# Patient Record
Sex: Female | Born: 1955 | Race: White | Hispanic: No | Marital: Married | Smoking: Never smoker
Health system: Southern US, Community
[De-identification: ages and names within clinical notes are randomized; demographics above are authoritative.]

## PROBLEM LIST (undated history)

## (undated) DIAGNOSIS — E079 Disorder of thyroid, unspecified: Secondary | ICD-10-CM

## (undated) DIAGNOSIS — M5020 Other cervical disc displacement, unspecified cervical region: Secondary | ICD-10-CM

## (undated) DIAGNOSIS — E039 Hypothyroidism, unspecified: Secondary | ICD-10-CM

## (undated) HISTORY — PX: APPENDECTOMY: SHX54

## (undated) HISTORY — PX: ABDOMINAL HYSTERECTOMY: SHX81

## (undated) HISTORY — DX: Disorder of thyroid, unspecified: E07.9

## (undated) HISTORY — PX: BREAST SURGERY: SHX581

---

## 1988-05-27 HISTORY — PX: BREAST BIOPSY: SHX20

## 2004-04-25 ENCOUNTER — Ambulatory Visit: Payer: Self-pay

## 2004-05-24 ENCOUNTER — Ambulatory Visit: Payer: Self-pay

## 2004-06-14 ENCOUNTER — Ambulatory Visit: Payer: Self-pay

## 2006-07-17 ENCOUNTER — Ambulatory Visit: Payer: Self-pay

## 2006-09-25 ENCOUNTER — Ambulatory Visit: Payer: Self-pay | Admitting: Gastroenterology

## 2008-11-15 ENCOUNTER — Ambulatory Visit: Payer: Self-pay | Admitting: Specialist

## 2009-12-28 ENCOUNTER — Ambulatory Visit: Payer: Self-pay | Admitting: Specialist

## 2014-04-29 DIAGNOSIS — T148XXA Other injury of unspecified body region, initial encounter: Secondary | ICD-10-CM | POA: Insufficient documentation

## 2015-10-24 DIAGNOSIS — F419 Anxiety disorder, unspecified: Secondary | ICD-10-CM | POA: Insufficient documentation

## 2015-10-24 DIAGNOSIS — E7800 Pure hypercholesterolemia, unspecified: Secondary | ICD-10-CM | POA: Insufficient documentation

## 2015-10-24 DIAGNOSIS — E039 Hypothyroidism, unspecified: Secondary | ICD-10-CM | POA: Insufficient documentation

## 2015-10-24 DIAGNOSIS — K589 Irritable bowel syndrome without diarrhea: Secondary | ICD-10-CM | POA: Insufficient documentation

## 2015-10-25 ENCOUNTER — Telehealth: Payer: Self-pay | Admitting: Gastroenterology

## 2015-10-25 NOTE — Telephone Encounter (Signed)
colonoscopy

## 2015-10-31 ENCOUNTER — Telehealth: Payer: Self-pay | Admitting: Gastroenterology

## 2015-10-31 NOTE — Telephone Encounter (Signed)
colonoscopy

## 2015-11-03 NOTE — Telephone Encounter (Signed)
LVM for pt to return my call.

## 2015-11-09 NOTE — Telephone Encounter (Signed)
Left vm again for pt to return my call. Mailed letter.  

## 2015-11-15 ENCOUNTER — Telehealth: Payer: Self-pay | Admitting: Gastroenterology

## 2015-11-15 NOTE — Telephone Encounter (Signed)
Patient is returning your call. Please call today.

## 2015-11-16 NOTE — Telephone Encounter (Signed)
Patient returned your call/colonoscopy

## 2015-11-17 ENCOUNTER — Other Ambulatory Visit: Payer: Self-pay

## 2015-11-17 NOTE — Telephone Encounter (Signed)
Pt scheduled for screening colonoscopy at [facility] on 12/12/15. Instructs/rx mailed. Please precert.  

## 2015-11-17 NOTE — Telephone Encounter (Signed)
Gastroenterology Pre-Procedure Review  Request Date: 12/12/15 Requesting Physician: Dr. Fulton Reek  PATIENT REVIEW QUESTIONS: The patient responded to the following health history questions as indicated:    1. Are you having any GI issues? no 2. Do you have a personal history of Polyps? no 3. Do you have a family history of Colon Cancer or Polyps? yes (Mother)  4. Diabetes Mellitus? no 5. Joint replacements in the past 12 months?no 6. Major health problems in the past 3 months?no 7. Any artificial heart valves, MVP, or defibrillator?no    MEDICATIONS & ALLERGIES:    Patient reports the following regarding taking any anticoagulation/antiplatelet therapy:   Plavix, Coumadin, Eliquis, Xarelto, Lovenox, Pradaxa, Brilinta, or Effient? no Aspirin? no  Patient confirms/reports the following medications:  No current outpatient prescriptions on file.   No current facility-administered medications for this visit.    Patient confirms/reports the following allergies:  No Known Allergies  No orders of the defined types were placed in this encounter.    AUTHORIZATION INFORMATION Primary Insurance: 1D#: Group #:  Secondary Insurance: 1D#: Group #:  SCHEDULE INFORMATION: Date: 12/12/15 Time: Location: [place]

## 2015-11-17 NOTE — Telephone Encounter (Signed)
LVM for pt to return my call.

## 2015-11-30 ENCOUNTER — Telehealth: Payer: Self-pay | Admitting: Gastroenterology

## 2015-11-30 NOTE — Telephone Encounter (Signed)
PLEASE CALL REGARDING COLONOSCOPY

## 2015-12-06 NOTE — Telephone Encounter (Signed)
Pt had questions about medications prior to colonoscopy. These questions answered.

## 2015-12-12 ENCOUNTER — Ambulatory Visit: Admission: RE | Admit: 2015-12-12 | Payer: 59 | Source: Ambulatory Visit | Admitting: Gastroenterology

## 2015-12-12 ENCOUNTER — Ambulatory Visit: Admit: 2015-12-12 | Payer: Self-pay | Admitting: Gastroenterology

## 2015-12-12 ENCOUNTER — Encounter: Admission: RE | Payer: Self-pay | Source: Ambulatory Visit

## 2015-12-12 SURGERY — COLONOSCOPY WITH PROPOFOL
Anesthesia: Choice

## 2015-12-12 SURGERY — COLONOSCOPY WITH PROPOFOL
Anesthesia: General

## 2016-08-12 ENCOUNTER — Encounter: Payer: Self-pay | Admitting: Gynecology

## 2016-08-12 ENCOUNTER — Ambulatory Visit (INDEPENDENT_AMBULATORY_CARE_PROVIDER_SITE_OTHER): Payer: 59 | Admitting: Gynecology

## 2016-08-12 ENCOUNTER — Other Ambulatory Visit: Payer: Self-pay | Admitting: Gynecology

## 2016-08-12 VITALS — BP 114/66 | Ht 63.0 in | Wt 130.0 lb

## 2016-08-12 DIAGNOSIS — Z23 Encounter for immunization: Secondary | ICD-10-CM

## 2016-08-12 DIAGNOSIS — N898 Other specified noninflammatory disorders of vagina: Secondary | ICD-10-CM

## 2016-08-12 DIAGNOSIS — Z79890 Hormone replacement therapy (postmenopausal): Secondary | ICD-10-CM | POA: Diagnosis not present

## 2016-08-12 DIAGNOSIS — Z01411 Encounter for gynecological examination (general) (routine) with abnormal findings: Secondary | ICD-10-CM

## 2016-08-12 DIAGNOSIS — N952 Postmenopausal atrophic vaginitis: Secondary | ICD-10-CM | POA: Diagnosis not present

## 2016-08-12 DIAGNOSIS — Z1231 Encounter for screening mammogram for malignant neoplasm of breast: Secondary | ICD-10-CM

## 2016-08-12 LAB — WET PREP FOR TRICH, YEAST, CLUE
CLUE CELLS WET PREP: NONE SEEN
TRICH WET PREP: NONE SEEN
Yeast Wet Prep HPF POC: NONE SEEN

## 2016-08-12 MED ORDER — ESTRADIOL 0.1 MG/GM VA CREA: TOPICAL_CREAM | VAGINAL | 8 refills | Status: DC

## 2016-08-12 NOTE — Addendum Note (Signed)
Addended by: Nelva Nay on: 08/12/2016 05:18 PM   Modules accepted: Orders

## 2016-08-12 NOTE — Patient Instructions (Signed)

## 2016-08-12 NOTE — Progress Notes (Signed)
Patient ID: Charlotte Zuniga, female   DOB: 1955-10-24, 61 y.o.   MRN: [id number]    Charlotte Zuniga April 26, 1956 [id number]   History:    61 y.o.  for annual gyn exam who is a new patient to the practice. Patient stated that her PCP has been doing her blood work and her vaccines are up-to-date. She's been complaining of vaginal dryness and discomfort during intercourse. He was not sure she had a vaginal discharge today or not. She is in a monogamous relationship. Her PCP treated her for yeast infection for which she took some fluconazole for 5 days. She states she recently menopause at age 70 and has had a prior abdominal hysterectomy for benign pathology. Her colonoscopy was reported to be benign in 2009 and her bone density study in 2017 she states had osteopenia and she is taking vitamin D and calcium. She denies any prior history of any abnormal Pap smear. Many years ago she recently menopause at 64 she took estrogen orally but discontinued after short time because of the side effects.  Past medical history,surgical history, family history and social history were all reviewed and documented in the EPIC chart.  Gynecologic History No LMP recorded. Patient has had a hysterectomy. Contraception: status post hysterectomy Last Pap: 2012. Results were: normal Last mammogram: 2 years ago. Results were: normal  Obstetric History OB History  Gravida Para Term Preterm AB Living  3 2     1 2   SAB TAB Ectopic Multiple Live Births  1            # Outcome Date GA Lbr Len/2nd Weight Sex Delivery Anes PTL Lv  3 SAB           2 Para      Vag-Spont     1 Para      Vag-Spont          ROS: A ROS was performed and pertinent positives and negatives are included in the history.  GENERAL: No fevers or chills. HEENT: No change in vision, no earache, sore throat or sinus congestion. NECK: No pain or stiffness. CARDIOVASCULAR: No chest pain or pressure. No palpitations. PULMONARY: No shortness of breath, cough or  wheeze. GASTROINTESTINAL: No abdominal pain, nausea, vomiting or diarrhea, melena or bright red blood per rectum. GENITOURINARY: No urinary frequency, urgency, hesitancy or dysuria. MUSCULOSKELETAL: No joint or muscle pain, no back pain, no recent trauma. DERMATOLOGIC: No rash, no itching, no lesions. ENDOCRINE: No polyuria, polydipsia, no heat or cold intolerance. No recent change in weight. HEMATOLOGICAL: No anemia or easy bruising or bleeding. NEUROLOGIC: No headache, seizures, numbness, tingling or weakness. PSYCHIATRIC: No depression, no loss of interest in normal activity or change in sleep pattern.     Exam: chaperone present  BP 114/66   Ht 5\' 3"  (1.6 m)   Wt 130 lb (59 kg)   BMI 23.03 kg/m   Body mass index is 23.03 kg/m.  General appearance : Well developed well nourished female. No acute distress HEENT: Eyes: no retinal hemorrhage or exudates,  Neck supple, trachea midline, no carotid bruits, no thyroidmegaly Lungs: Clear to auscultation, no rhonchi or wheezes, or rib retractions  Heart: Regular rate and rhythm, no murmurs or gallops Breast:Examined in sitting and supine position were symmetrical in appearance, no palpable masses or tenderness,  no skin retraction, no nipple inversion, no nipple discharge, no skin discoloration, no axillary or supraclavicular lymphadenopathy Abdomen: no palpable masses or tenderness, no rebound or guarding Extremities: no  edema or skin discoloration or tenderness  Pelvic:  Bartholin, Urethra, Skene Glands: Within normal limits             Vagina: No gross lesions or discharge, vaginal atrophy  Cervix:absent  Uterus  absent  Adnexa  Without masses or tenderness  Anus and perineum  normal   Rectovaginal  normal sphincter tone without palpated masses or tenderness             Hemoccult to schedule colonoscopy this year   Wet prep negative few white blood cell few bacteria  Assessment/Plan:  61 y.o. female for annual exam with vaginal  atrophy. I've recommended vaginal estrogen 1 g intravaginally twice a week and to apply a small amount of the external genitalia is well. The risks benefits and pros and cons of the medication were discussed. Patient did not need a Pap smear today. Patient was given a requisition to schedule her mammogram as well as her colonoscopy. Her blood work and vaccines have been done by her PCP.   Terrance Mass MD, 4:11 PM 08/12/2016

## 2016-08-19 ENCOUNTER — Telehealth: Payer: Self-pay | Admitting: Gastroenterology

## 2016-08-19 ENCOUNTER — Other Ambulatory Visit: Payer: Self-pay

## 2016-08-19 DIAGNOSIS — Z800 Family history of malignant neoplasm of digestive organs: Secondary | ICD-10-CM

## 2016-08-19 DIAGNOSIS — Z1211 Encounter for screening for malignant neoplasm of colon: Secondary | ICD-10-CM

## 2016-08-19 NOTE — Telephone Encounter (Signed)
auth# [id number] ded 1500 rem 1500 Max oop 6000 rem [id number] pu 80% auth done  This would apply only if your insurance company considers this as a diagnostic procedure. Most screening procedures are covered at 100%.

## 2016-08-19 NOTE — Telephone Encounter (Signed)
Gastroenterology Pre-Procedure Review  Request Date:  Requesting Physician: Dr.   PATIENT REVIEW QUESTIONS: The patient responded to the following health history questions as indicated:    1. Are you having any GI issues? no 2. Do you have a personal history of Polyps? no 3. Do you have a family history of Colon Cancer or Polyps? yes (Mother) 4. Diabetes Mellitus? no 5. Joint replacements in the past 12 months?no 6. Major health problems in the past 3 months?no 7. Any artificial heart valves, MVP, or defibrillator?no    MEDICATIONS & ALLERGIES:    Patient reports the following regarding taking any anticoagulation/antiplatelet therapy:   Plavix, Coumadin, Eliquis, Xarelto, Lovenox, Pradaxa, Brilinta, or Effient? no Aspirin? no  Patient confirms/reports the following medications:  Current Outpatient Prescriptions  Medication Sig Dispense Refill  . estradiol (ESTRACE) 0.1 MG/GM vaginal cream Apply 1 gram vaginally twice a week 42.5 g 8  . levothyroxine (SYNTHROID, LEVOTHROID) 75 MCG tablet Take 75 mcg by mouth daily before breakfast.     No current facility-administered medications for this visit.     Patient confirms/reports the following allergies:  No Known Allergies  No orders of the defined types were placed in this encounter.   AUTHORIZATION INFORMATION Primary Insurance: 1D#: Group #:  Secondary Insurance: 1D#: Group #:  SCHEDULE INFORMATION: Date: 09/24/16 Time: Location: [place]

## 2016-08-19 NOTE — Telephone Encounter (Signed)
Pt scheduled for a screening colonoscopy at [facility] on 09/24/16 with Wohl.  Screening Z12.11 Family hx of colon cancer Z80.0

## 2016-08-19 NOTE — Telephone Encounter (Signed)
Patient ready to schedule a screening colonoscopy.

## 2016-08-21 ENCOUNTER — Ambulatory Visit
Admission: RE | Admit: 2016-08-21 | Discharge: 2016-08-21 | Disposition: A | Discharge status: Home / Self Care | Payer: 59 | Source: Ambulatory Visit | Attending: Gynecology | Admitting: Gynecology

## 2016-08-21 DIAGNOSIS — Z1231 Encounter for screening mammogram for malignant neoplasm of breast: Secondary | ICD-10-CM

## 2016-08-23 ENCOUNTER — Other Ambulatory Visit: Payer: Self-pay | Admitting: Gynecology

## 2016-08-23 DIAGNOSIS — R928 Other abnormal and inconclusive findings on diagnostic imaging of breast: Secondary | ICD-10-CM

## 2016-08-27 ENCOUNTER — Other Ambulatory Visit: Payer: 59

## 2016-08-29 ENCOUNTER — Telehealth: Payer: Self-pay | Admitting: *Deleted

## 2016-08-29 MED ORDER — FLUCONAZOLE 150 MG PO TABS: 150.0000 mg | ORAL_TABLET | Freq: Once | ORAL | 0 refills | Status: AC

## 2016-08-29 NOTE — Telephone Encounter (Signed)
Pt informed, pt was seen for annual on 08/12/16, Rx sent.

## 2016-08-29 NOTE — Telephone Encounter (Signed)
Call her in a prescription for Diflucan 150 mg PO. BUT....REMIND HER AND GIVE HER AN APPOINTMENT FOR RGCE THAT WAS DUE LAST MONTH. Thank you!

## 2016-08-29 NOTE — Telephone Encounter (Signed)
Pt called c/o yeast infection itching white discharge, pt said she has recurrent yeast sometimes, asked if you would be so kind to send in a Rx? Please advise

## 2016-08-30 ENCOUNTER — Ambulatory Visit
Admission: RE | Admit: 2016-08-30 | Discharge: 2016-08-30 | Disposition: A | Discharge status: Home / Self Care | Payer: 59 | Source: Ambulatory Visit | Attending: Gynecology | Admitting: Gynecology

## 2016-08-30 DIAGNOSIS — R928 Other abnormal and inconclusive findings on diagnostic imaging of breast: Secondary | ICD-10-CM

## 2016-09-24 ENCOUNTER — Encounter
Admission: RE | Disposition: A | Discharge status: Home / Self Care | Payer: Self-pay | Source: Ambulatory Visit | Attending: Gastroenterology

## 2016-09-24 ENCOUNTER — Ambulatory Visit
Admission: RE | Admit: 2016-09-24 | Discharge: 2016-09-24 | Disposition: A | Discharge status: Home / Self Care | Payer: 59 | Source: Ambulatory Visit | Attending: Gastroenterology | Admitting: Gastroenterology

## 2016-09-24 ENCOUNTER — Ambulatory Visit: Payer: 59 | Admitting: Anesthesiology

## 2016-09-24 DIAGNOSIS — E039 Hypothyroidism, unspecified: Secondary | ICD-10-CM | POA: Diagnosis not present

## 2016-09-24 DIAGNOSIS — D122 Benign neoplasm of ascending colon: Secondary | ICD-10-CM | POA: Diagnosis not present

## 2016-09-24 DIAGNOSIS — Z1211 Encounter for screening for malignant neoplasm of colon: Secondary | ICD-10-CM | POA: Insufficient documentation

## 2016-09-24 DIAGNOSIS — K635 Polyp of colon: Secondary | ICD-10-CM | POA: Insufficient documentation

## 2016-09-24 DIAGNOSIS — D123 Benign neoplasm of transverse colon: Secondary | ICD-10-CM | POA: Insufficient documentation

## 2016-09-24 DIAGNOSIS — D125 Benign neoplasm of sigmoid colon: Secondary | ICD-10-CM

## 2016-09-24 DIAGNOSIS — Z79899 Other long term (current) drug therapy: Secondary | ICD-10-CM | POA: Insufficient documentation

## 2016-09-24 DIAGNOSIS — Z800 Family history of malignant neoplasm of digestive organs: Secondary | ICD-10-CM | POA: Insufficient documentation

## 2016-09-24 HISTORY — DX: Hypothyroidism, unspecified: E03.9

## 2016-09-24 HISTORY — PX: COLONOSCOPY WITH PROPOFOL: SHX5780

## 2016-09-24 SURGERY — COLONOSCOPY WITH PROPOFOL
Anesthesia: General

## 2016-09-24 MED ORDER — PROPOFOL 10 MG/ML IV BOLUS
INTRAVENOUS | Status: AC
  Filled 2016-09-24: qty 20

## 2016-09-24 MED ORDER — PROPOFOL 10 MG/ML IV BOLUS
INTRAVENOUS | Status: DC | PRN
  Administered 2016-09-24: 20 mg via INTRAVENOUS
  Administered 2016-09-24: 40 mg via INTRAVENOUS
  Administered 2016-09-24: 30 mg via INTRAVENOUS
  Administered 2016-09-24: 20 mg via INTRAVENOUS

## 2016-09-24 MED ORDER — LIDOCAINE HCL (CARDIAC) 20 MG/ML IV SOLN
INTRAVENOUS | Status: DC | PRN
  Administered 2016-09-24: 50 mg via INTRAVENOUS

## 2016-09-24 MED ORDER — LIDOCAINE HCL (PF) 2 % IJ SOLN
INTRAMUSCULAR | Status: AC
  Filled 2016-09-24: qty 2

## 2016-09-24 MED ORDER — PROPOFOL 500 MG/50ML IV EMUL
INTRAVENOUS | Status: AC
  Filled 2016-09-24: qty 50

## 2016-09-24 MED ORDER — PROPOFOL 500 MG/50ML IV EMUL
INTRAVENOUS | Status: DC | PRN
  Administered 2016-09-24: 150 ug/kg/min via INTRAVENOUS

## 2016-09-24 MED ORDER — SODIUM CHLORIDE 0.9 % IV SOLN
INTRAVENOUS | Status: DC
  Administered 2016-09-24: 08:00:00 via INTRAVENOUS

## 2016-09-24 NOTE — Anesthesia Postprocedure Evaluation (Signed)
Anesthesia Post Note  Patient: MAELYS KINNICK  Procedure(s) Performed: Procedure(s) (LRB): COLONOSCOPY WITH PROPOFOL (N/A)  Patient location during evaluation: PACU Anesthesia Type: General Level of consciousness: awake and alert and oriented Pain management: pain level controlled Vital Signs Assessment: post-procedure vital signs reviewed and stable Respiratory status: spontaneous breathing Cardiovascular status: blood pressure returned to baseline Anesthetic complications: no     Last Vitals:  Vitals:   09/24/16 0721 09/24/16 0811  BP: 129/63 96/77  Pulse: 68 72  Resp: 18 12  Temp: (!) 35.6 C (!) 35.7 C    Last Pain:  Vitals:   09/24/16 0811  TempSrc: Tympanic                 Zuha Dejonge

## 2016-09-24 NOTE — H&P (Signed)
   Charlotte Lame, MD [address] [place], [place] [address] Phone: [phone] Fax : [phone]  Primary Care Physician:  Charlotte Crouch, MD Primary Gastroenterologist:  Dr. Allen Zuniga  Pre-Procedure History & Physical: HPI:  Charlotte Zuniga is a 61 y.o. female is here for a screening colonoscopy.   Past Medical History:  Diagnosis Date  . Hypothyroidism   . Thyroid disease    Hypothyroid    Past Surgical History:  Procedure Laterality Date  . ABDOMINAL HYSTERECTOMY    . APPENDECTOMY    . BREAST BIOPSY  1990   pt is poor historian, does not remember which breast  . BREAST SURGERY     Benign calcifications    Prior to Admission medications   Medication Sig Start Date End Date Taking? Authorizing Provider  levothyroxine (SYNTHROID, LEVOTHROID) 75 MCG tablet Take 75 mcg by mouth daily before breakfast.   Yes Historical Provider, MD  estradiol (ESTRACE) 0.1 MG/GM vaginal cream Apply 1 gram vaginally twice a week 08/12/16   Terrance Mass, MD    Allergies as of 08/19/2016  . (No Known Allergies)    Family History  Problem Relation Age of Onset  . Cancer Mother     Colon  . Stroke Brother     Social History   Social History  . Marital status: Married    Spouse name: N/A  . Number of children: N/A  . Years of education: N/A   Occupational History  . Not on file.   Social History Main Topics  . Smoking status: Never Smoker  . Smokeless tobacco: Never Used  . Alcohol use Yes     Comment: last week  . Drug use: No  . Sexual activity: Yes     Comment: 1st intercourse 32 yo-1 partner   Other Topics Concern  . Not on file   Social History Narrative  . No narrative on file    Review of Systems: See HPI, otherwise negative ROS  Physical Exam: BP 129/63   Pulse 68   Temp (!) 96.1 F (35.6 C) (Tympanic)   Resp 18   Ht 5\' 2"  (1.575 m)   Wt 127 lb (57.6 kg)   SpO2 100%   BMI 23.23 kg/m  General:   Alert,  pleasant and cooperative in  NAD Head:  Normocephalic and atraumatic. Neck:  Supple; no masses or thyromegaly. Lungs:  Clear throughout to auscultation.    Heart:  Regular rate and rhythm. Abdomen:  Soft, nontender and nondistended. Normal bowel sounds, without guarding, and without rebound.   Neurologic:  Alert and  oriented x4;  grossly normal neurologically.  Impression/Plan: Charlotte Zuniga is now here to undergo a screening colonoscopy.  Risks, benefits, and alternatives regarding colonoscopy have been reviewed with the patient.  Questions have been answered.  All parties agreeable.

## 2016-09-24 NOTE — Transfer of Care (Signed)
Immediate Anesthesia Transfer of Care Note  Patient: Charlotte Zuniga  Procedure(s) Performed: Procedure(s): COLONOSCOPY WITH PROPOFOL (N/A)  Patient Location: PACU  Anesthesia Type:General  Level of Consciousness: awake, alert , oriented and patient cooperative  Airway & Oxygen Therapy: Patient Spontanous Breathing and Patient connected to nasal cannula oxygen  Post-op Assessment: Report given to RN and Post -op Vital signs reviewed and stable  Post vital signs: Reviewed and stable  Last Vitals:  Vitals:   09/24/16 0721 09/24/16 0811  BP: 129/63 96/77  Pulse: 68 72  Resp: 18 12  Temp: (!) 35.6 C (!) 35.7 C    Last Pain:  Vitals:   09/24/16 0811  TempSrc: Tympanic         Complications: No apparent anesthesia complications

## 2016-09-24 NOTE — Anesthesia Post-op Follow-up Note (Cosign Needed)
Anesthesia QCDR form completed.        

## 2016-09-24 NOTE — Anesthesia Preprocedure Evaluation (Signed)
Anesthesia Evaluation  Patient identified by MRN, date of birth, ID band Patient awake    Reviewed: Allergy & Precautions, NPO status , Patient's Chart, lab work & pertinent test results  Airway Mallampati: II       Dental   Pulmonary neg pulmonary ROS,    Pulmonary exam normal        Cardiovascular negative cardio ROS Normal cardiovascular exam     Neuro/Psych negative neurological ROS  negative psych ROS   GI/Hepatic negative GI ROS, Neg liver ROS,   Endo/Other  Hypothyroidism   Renal/GU negative Renal ROS  Female GU complaint     Musculoskeletal negative musculoskeletal ROS (+)   Abdominal Normal abdominal exam  (+)   Peds negative pediatric ROS (+)  Hematology negative hematology ROS (+)   Anesthesia Other Findings   Reproductive/Obstetrics                             Anesthesia Physical Anesthesia Plan  ASA: II  Anesthesia Plan: General   Post-op Pain Management:    Induction: Intravenous  Airway Management Planned: Nasal Cannula  Additional Equipment:   Intra-op Plan:   Post-operative Plan:   Informed Consent: I have reviewed the patients History and Physical, chart, labs and discussed the procedure including the risks, benefits and alternatives for the proposed anesthesia with the patient or authorized representative who has indicated his/her understanding and acceptance.   Dental advisory given  Plan Discussed with: CRNA and Surgeon  Anesthesia Plan Comments:         Anesthesia Quick Evaluation

## 2016-09-24 NOTE — Op Note (Signed)
[facility] Gastroenterology Patient Name: Charlotte Zuniga Procedure Date: 09/24/2016 7:46 AM MRN: [id number] Account #: [id number] Date of Birth: 20-Aug-1955 Admit Type: Outpatient Age: 61 Room: [facility] ENDO ROOM 4 Gender: Female Note Status: Finalized Procedure:            Colonoscopy Indications:          Screening for colorectal malignant neoplasm Providers:            Lucilla Lame MD, MD Referring MD:         Leonie Douglas. Doy Hutching, MD (Referring MD) Medicines:            Propofol per Anesthesia Complications:        No immediate complications. Procedure:            Pre-Anesthesia Assessment:                       - Prior to the procedure, a History and Physical was                        performed, and patient medications and allergies were                        reviewed. The patient's tolerance of previous                        anesthesia was also reviewed. The risks and benefits of                        the procedure and the sedation options and risks were                        discussed with the patient. All questions were                        answered, and informed consent was obtained. Prior                        Anticoagulants: The patient has taken no previous                        anticoagulant or antiplatelet agents. ASA Grade                        Assessment: II - A patient with mild systemic disease.                        After reviewing the risks and benefits, the patient was                        deemed in satisfactory condition to undergo the                        procedure.                       After obtaining informed consent, the colonoscope was                        passed under direct vision. Throughout the procedure,  the patient's blood pressure, pulse, and oxygen                        saturations were monitored continuously. The                        Colonoscope was introduced through the anus and          advanced to the the cecum, identified by appendiceal                        orifice and ileocecal valve. The colonoscopy was                        performed without difficulty. The patient tolerated the                        procedure well. The quality of the bowel preparation                        was excellent. Findings:      The perianal and digital rectal examinations were normal.      A 3 mm polyp was found in the sigmoid colon. The polyp was sessile. The       polyp was removed with a cold biopsy forceps. Resection and retrieval       were complete.      Two sessile polyps were found in the transverse colon. The polyps were 2       to 3 mm in size. These polyps were removed with a cold biopsy forceps.       Resection and retrieval were complete.      A 3 mm polyp was found in the ascending colon. The polyp was sessile.       The polyp was removed with a cold biopsy forceps. Resection and       retrieval were complete. Impression:           - One 3 mm polyp in the sigmoid colon, removed with a                        cold biopsy forceps. Resected and retrieved.                       - Two 2 to 3 mm polyps in the transverse colon, removed                        with a cold biopsy forceps. Resected and retrieved.                       - One 3 mm polyp in the ascending colon, removed with a                        cold biopsy forceps. Resected and retrieved. Recommendation:       - Discharge patient to home.                       - Resume previous diet.                       - Continue present medications.                       -  Await pathology results.                       - Repeat colonoscopy in 5 years if polyp adenoma and 10                        years if hyperplastic Procedure Code(s):    --- Professional ---                       [phone], Colonoscopy, flexible; with biopsy, single or                        multiple Diagnosis Code(s):    --- Professional ---                        Z12.11, Encounter for screening for malignant neoplasm                        of colon                       D12.5, Benign neoplasm of sigmoid colon                       D12.2, Benign neoplasm of ascending colon                       D12.3, Benign neoplasm of transverse colon (hepatic                        flexure or splenic flexure) CPT copyright 2016 American [employer] Association. All rights reserved. The codes documented in this report are preliminary and upon coder review may  be revised to meet current compliance requirements. Lucilla Lame MD, MD 09/24/2016 8:09:06 AM This report has been signed electronically. Number of Addenda: 0 Note Initiated On: 09/24/2016 7:46 AM Scope Withdrawal Time: 0 hours 7 minutes 23 seconds  Total Procedure Duration: 0 hours 15 minutes 23 seconds       [facility]

## 2016-09-25 ENCOUNTER — Encounter: Payer: Self-pay | Admitting: Gastroenterology

## 2016-09-25 LAB — SURGICAL PATHOLOGY

## 2016-09-26 ENCOUNTER — Encounter: Payer: Self-pay | Admitting: Gastroenterology

## 2016-10-09 ENCOUNTER — Encounter: Payer: Self-pay | Admitting: Gynecology

## 2017-06-22 IMAGING — US ULTRASOUND RIGHT BREAST LIMITED
1 series · 3 of 3 positions shown · non-contrast
Comparison: Mammography 08/21/2016, 05/24/2004.

CLINICAL DATA: Recall from 2D screening mammography, possible focal
asymmetry in the axillary tail of the right breast visualized only
on the MLO view.

EXAM:
2D DIGITAL DIAGNOSTIC RIGHT MAMMOGRAM WITH CAD AND ADJUNCT TOMO
ULTRASOUND RIGHT BREAST

[Series 1: ultrasound right breast limited · 0.06mm/px · 3 of 3 slices shown]
[im 1/3]
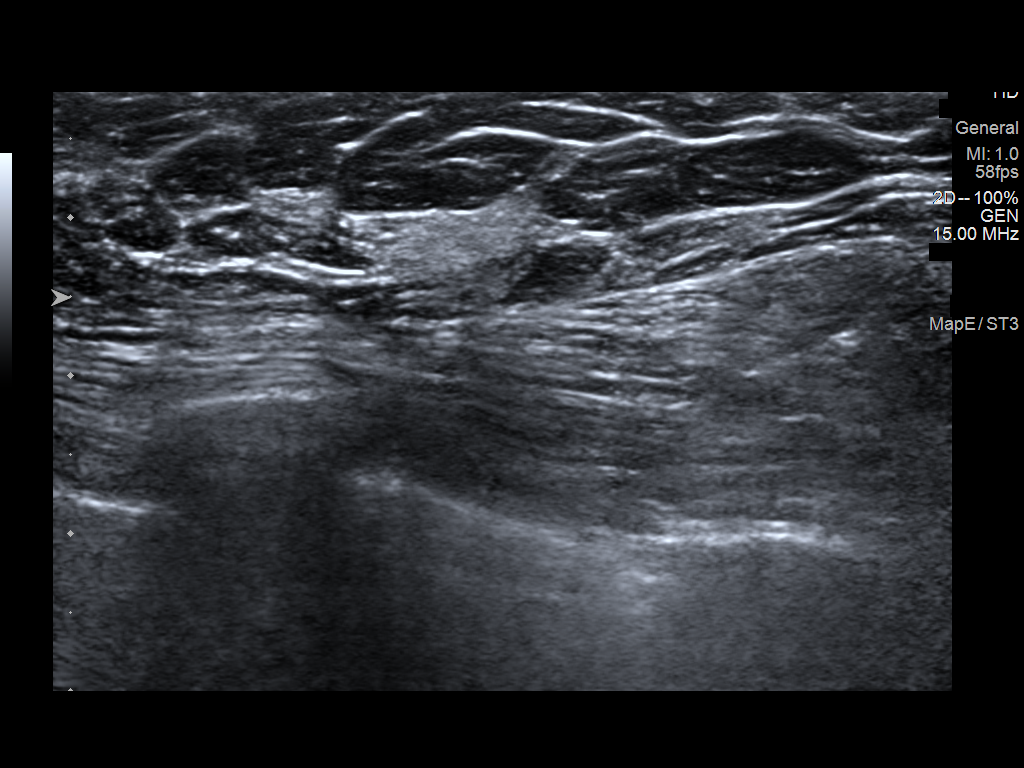
[im 2/3]
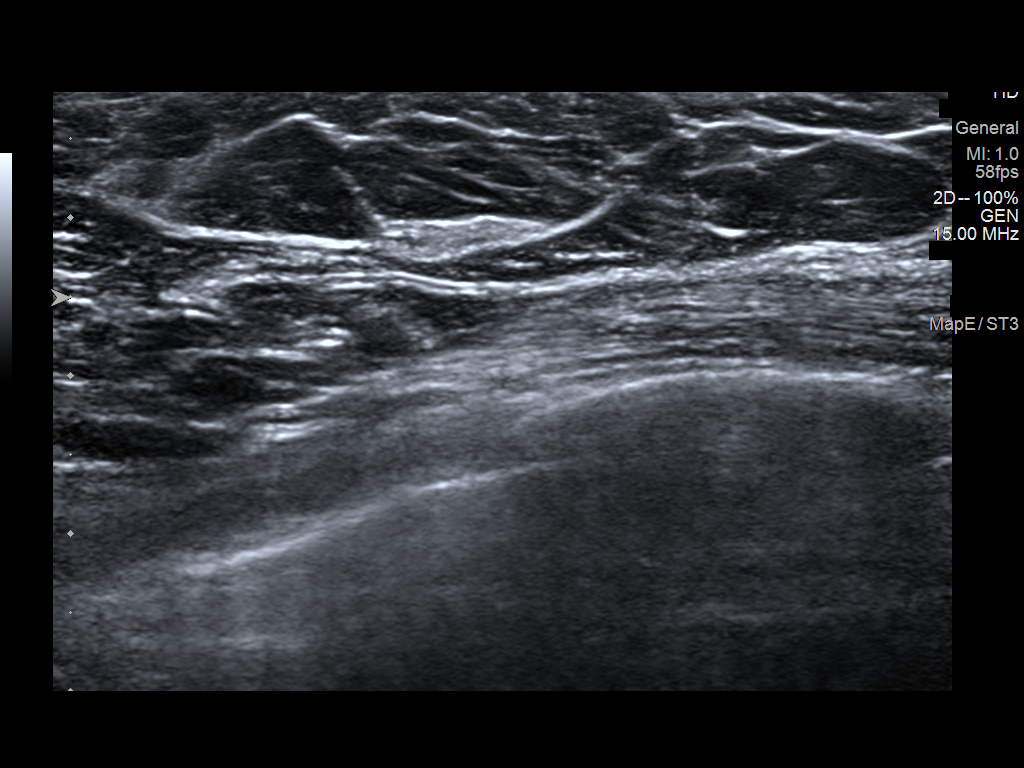
[im 3/3]
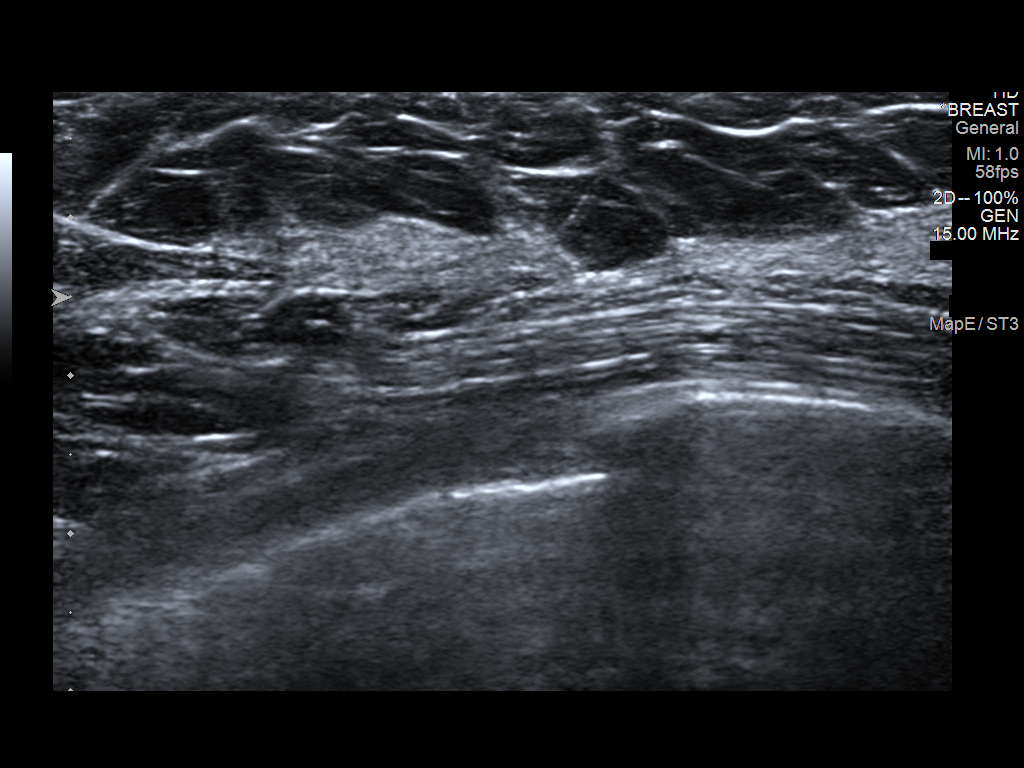

[3 of 3 positions shown; findings below may reference images not displayed]

ACR Breast Density Category c: The breast tissue is heterogeneously
dense, which may obscure small masses.
FINDINGS: Standard 2D and tomosynthesis full field CC and MLO views of the
right breast were obtained. A standard and tomosynthesis
spot-compression MLO view of the area of concern in the right breast
was also obtained.

The focal asymmetry disperses upon compression there is no
underlying mass, architectural distortion or suspicious
calcification. No suspicious findings elsewhere in the right breast
on the tomosynthesis images.

Mammographic images were processed with CAD.

On physical exam, there is no palpable abnormality in the axillary
tail of the right breast.

Targeted right breast ultrasound is performed, showing an isolated
island of fibroglandular tissue in the axillary tail at the 10:30
o'clock position approximately 9 cm from the nipple, corresponding
to the mammographic finding. No cyst, solid mass or abnormal
acoustic shadowing is identified.
IMPRESSION: Isolated island of fibroglandular tissue in the axillary tail of the
right breast accounts for the area of concern on screening
mammography. No mammographic evidence of malignancy, right breast.

RECOMMENDATION:
Screening mammogram in one year.(Code:ZW-I-3GO)

I have discussed the findings and recommendations with the patient.
Results were also provided in writing at the conclusion of the
visit. If applicable, a reminder letter will be sent to the patient
regarding the next appointment.

BI-RADS CATEGORY  1: Negative.

## 2017-06-22 IMAGING — MG 2D DIGITAL DIAGNOSTIC UNILATERAL RIGHT MAMMOGRAM WITH CAD AND AD
6 of 9 series · 6 of 21 positions shown · non-contrast
Comparison: Mammography 08/21/2016, 05/24/2004.

CLINICAL DATA: Recall from 2D screening mammography, possible focal
asymmetry in the axillary tail of the right breast visualized only
on the MLO view.

EXAM:
2D DIGITAL DIAGNOSTIC RIGHT MAMMOGRAM WITH CAD AND ADJUNCT TOMO
ULTRASOUND RIGHT BREAST

[R MLO synth-2D (1 of 2)]
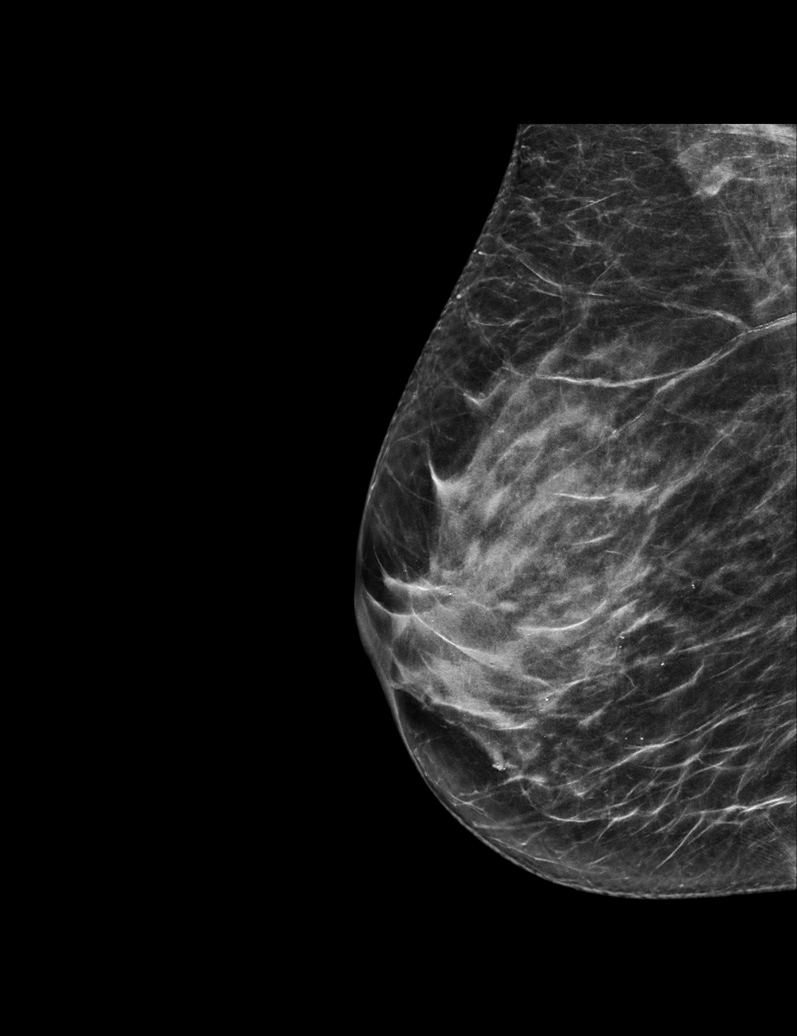

[R MLO (1 of 2)]
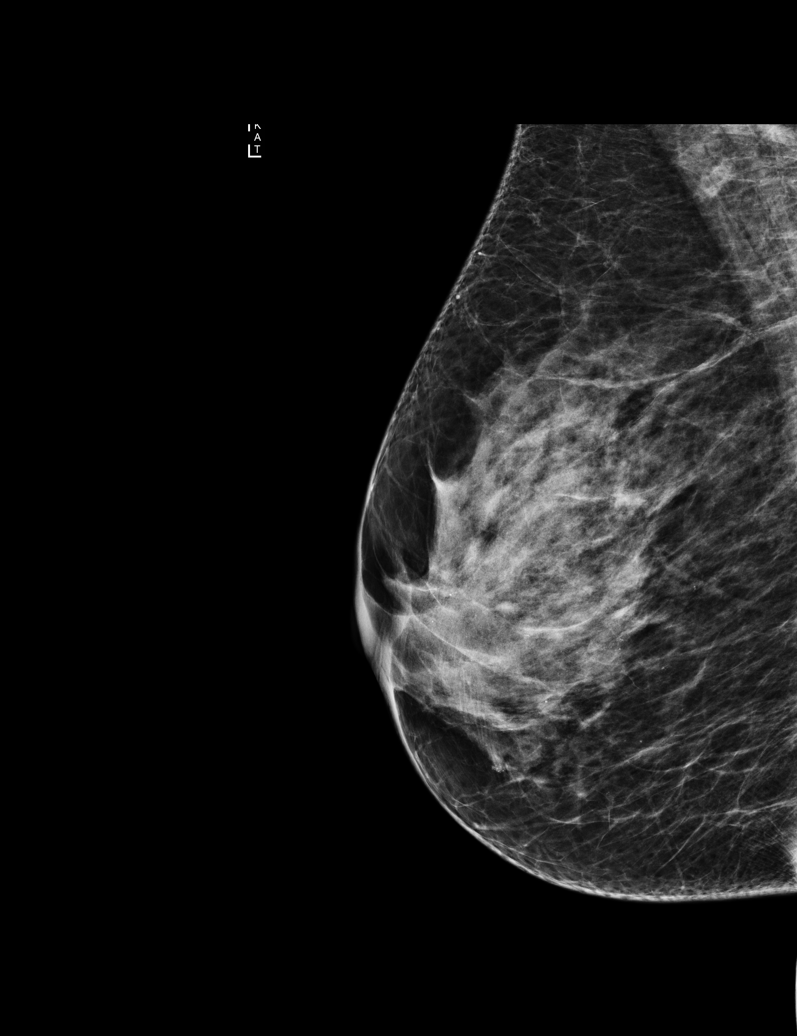

[R CC synth-2D]
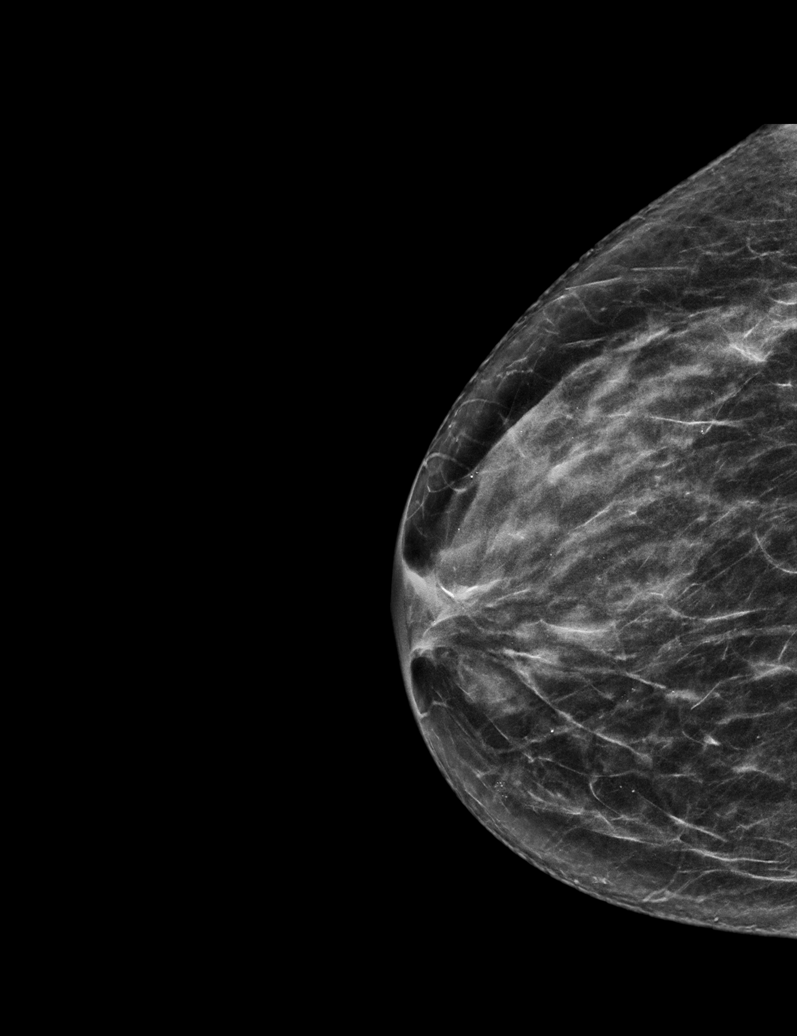

[R MLO synth-2D (2 of 2)]
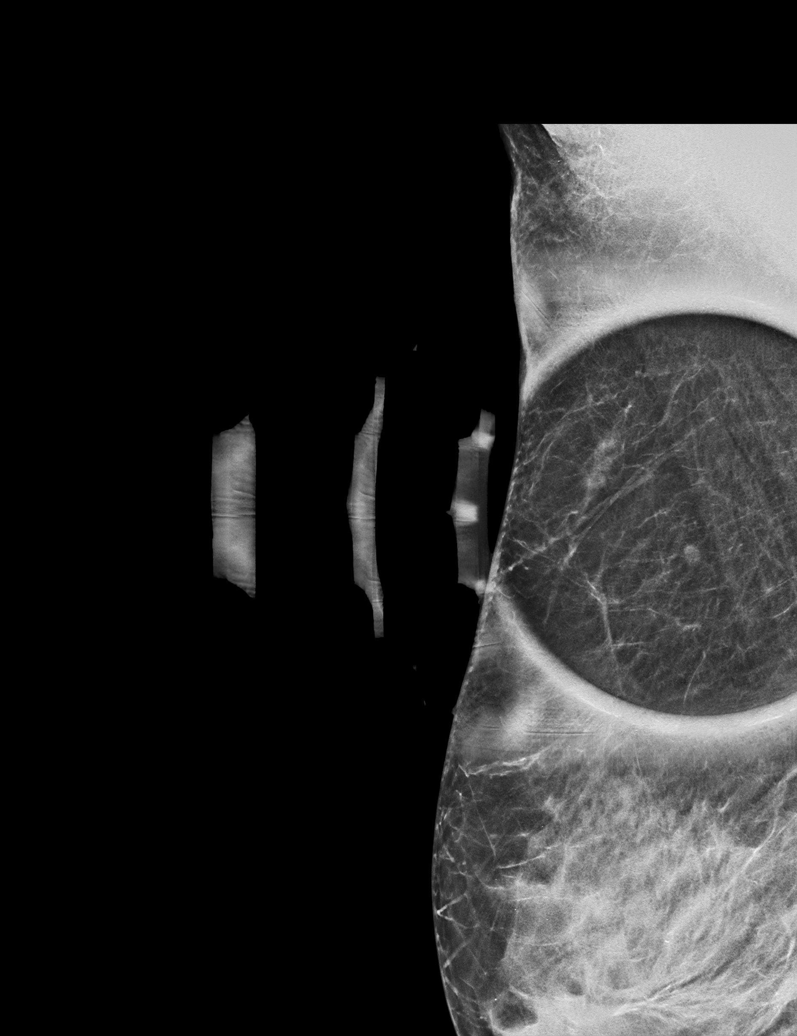

[R CC]
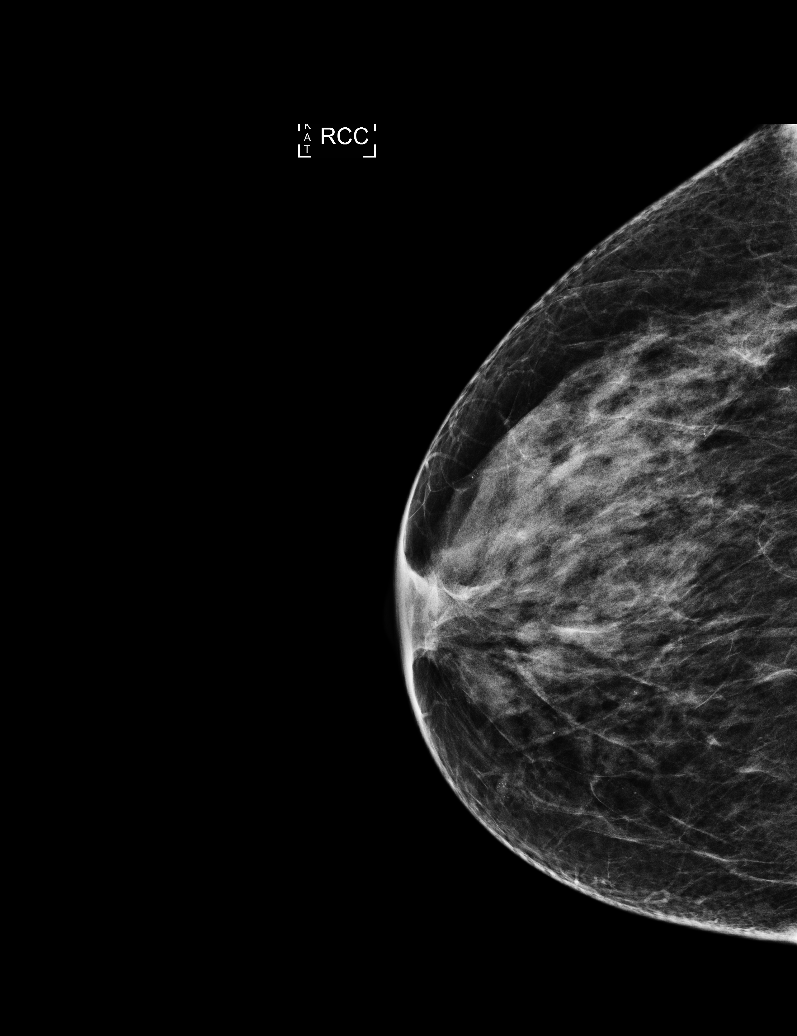

[R MLO (2 of 2)]
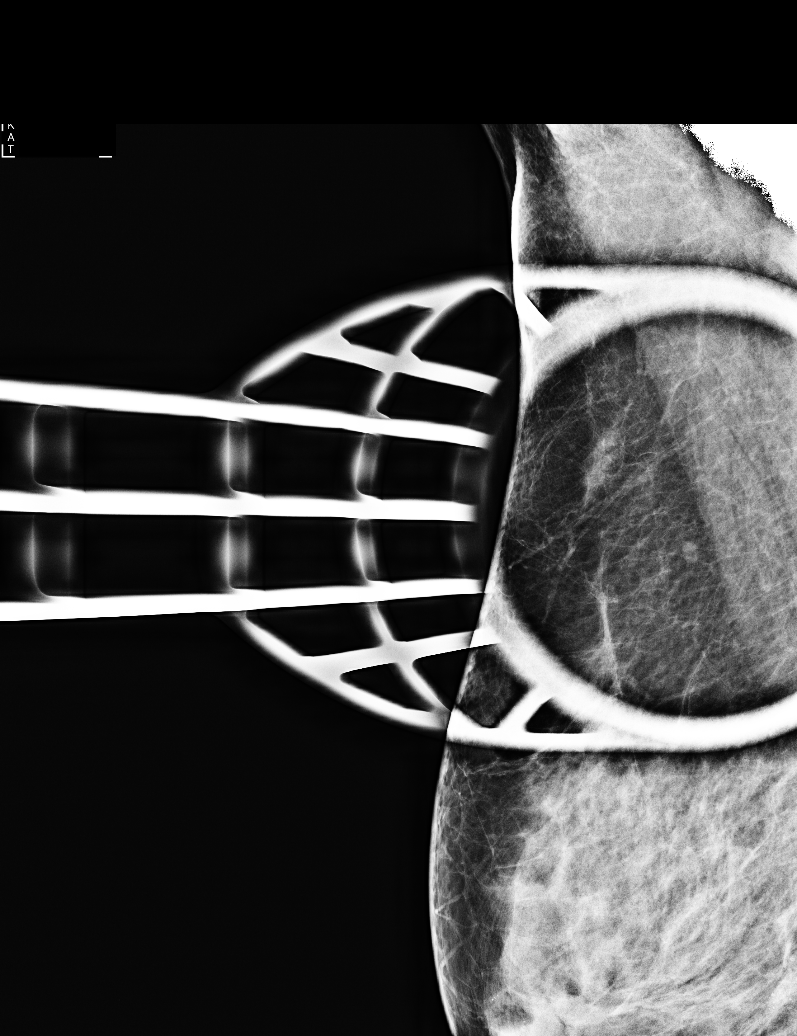

[6 of 21 positions shown; findings below may reference images not displayed]

ACR Breast Density Category c: The breast tissue is heterogeneously
dense, which may obscure small masses.
FINDINGS: Standard 2D and tomosynthesis full field CC and MLO views of the
right breast were obtained. A standard and tomosynthesis
spot-compression MLO view of the area of concern in the right breast
was also obtained.

The focal asymmetry disperses upon compression there is no
underlying mass, architectural distortion or suspicious
calcification. No suspicious findings elsewhere in the right breast
on the tomosynthesis images.

Mammographic images were processed with CAD.

On physical exam, there is no palpable abnormality in the axillary
tail of the right breast.

Targeted right breast ultrasound is performed, showing an isolated
island of fibroglandular tissue in the axillary tail at the 10:30
o'clock position approximately 9 cm from the nipple, corresponding
to the mammographic finding. No cyst, solid mass or abnormal
acoustic shadowing is identified.
IMPRESSION: Isolated island of fibroglandular tissue in the axillary tail of the
right breast accounts for the area of concern on screening
mammography. No mammographic evidence of malignancy, right breast.

RECOMMENDATION:
Screening mammogram in one year.(Code:ZW-I-3GO)

I have discussed the findings and recommendations with the patient.
Results were also provided in writing at the conclusion of the
visit. If applicable, a reminder letter will be sent to the patient
regarding the next appointment.

BI-RADS CATEGORY  1: Negative.

## 2017-08-13 ENCOUNTER — Encounter: Payer: 59 | Admitting: Gynecology

## 2017-08-15 ENCOUNTER — Encounter: Payer: 59 | Admitting: Gynecology

## 2018-10-05 ENCOUNTER — Telehealth: Payer: Self-pay | Admitting: *Deleted

## 2018-10-05 NOTE — Telephone Encounter (Signed)
Patient called c/o yeast infection, last seen in 2018, I explained last seen in 2018 will need a visit. Patient declined to come in, I recommended she used OTC Miostat. I explained needs to schedule annual exam,. She declined as well.

## 2019-02-23 ENCOUNTER — Encounter: Payer: Self-pay | Admitting: Gynecology

## 2020-02-28 ENCOUNTER — Ambulatory Visit: Payer: Self-pay | Attending: Internal Medicine

## 2020-02-28 DIAGNOSIS — Z23 Encounter for immunization: Secondary | ICD-10-CM

## 2020-02-28 NOTE — Progress Notes (Signed)
   Covid-19 Vaccination Clinic  Name:  Charlotte Zuniga    MRN: [id number] DOB: 10-16-1955  02/28/2020  [name] was observed post Covid-19 immunization for 15 minutes without incident. She was provided with Vaccine Information Sheet and instruction to access the V-Safe system.   [name] was instructed to call 911 with any severe reactions post vaccine: Marland Kitchen Difficulty breathing  . Swelling of face and throat  . A fast heartbeat  . A bad rash all over body  . Dizziness and weakness

## 2020-06-20 DIAGNOSIS — M542 Cervicalgia: Secondary | ICD-10-CM | POA: Insufficient documentation

## 2020-11-05 DIAGNOSIS — M5412 Radiculopathy, cervical region: Secondary | ICD-10-CM | POA: Insufficient documentation

## 2021-09-06 ENCOUNTER — Other Ambulatory Visit: Payer: Self-pay | Admitting: Internal Medicine

## 2021-09-06 ENCOUNTER — Ambulatory Visit
Admission: RE | Admit: 2021-09-06 | Discharge: 2021-09-06 | Disposition: A | Discharge status: Home / Self Care | Payer: Managed Care, Other (non HMO) | Source: Ambulatory Visit | Attending: Internal Medicine | Admitting: Internal Medicine

## 2021-09-06 DIAGNOSIS — E222 Syndrome of inappropriate secretion of antidiuretic hormone: Secondary | ICD-10-CM

## 2021-09-06 DIAGNOSIS — R059 Cough, unspecified: Secondary | ICD-10-CM

## 2021-09-07 ENCOUNTER — Other Ambulatory Visit: Payer: Self-pay | Admitting: Physician Assistant

## 2021-09-07 DIAGNOSIS — E222 Syndrome of inappropriate secretion of antidiuretic hormone: Secondary | ICD-10-CM

## 2021-09-07 DIAGNOSIS — R059 Cough, unspecified: Secondary | ICD-10-CM

## 2021-09-07 DIAGNOSIS — R9389 Abnormal findings on diagnostic imaging of other specified body structures: Secondary | ICD-10-CM

## 2021-11-21 DIAGNOSIS — M51369 Other intervertebral disc degeneration, lumbar region without mention of lumbar back pain or lower extremity pain: Secondary | ICD-10-CM | POA: Insufficient documentation

## 2022-01-07 ENCOUNTER — Emergency Department
Admission: EM | Admit: 2022-01-07 | Discharge: 2022-01-07 | Discharge status: Against Medical Advice | Payer: Managed Care, Other (non HMO) | Attending: Emergency Medicine | Admitting: Emergency Medicine

## 2022-01-07 DIAGNOSIS — Y92410 Unspecified street and highway as the place of occurrence of the external cause: Secondary | ICD-10-CM | POA: Diagnosis not present

## 2022-01-07 DIAGNOSIS — Z5321 Procedure and treatment not carried out due to patient leaving prior to being seen by health care provider: Secondary | ICD-10-CM | POA: Insufficient documentation

## 2022-01-07 DIAGNOSIS — R079 Chest pain, unspecified: Secondary | ICD-10-CM | POA: Diagnosis not present

## 2022-01-07 DIAGNOSIS — M542 Cervicalgia: Secondary | ICD-10-CM | POA: Diagnosis not present

## 2022-01-07 DIAGNOSIS — M546 Pain in thoracic spine: Secondary | ICD-10-CM | POA: Diagnosis not present

## 2022-01-07 DIAGNOSIS — S40811A Abrasion of right upper arm, initial encounter: Secondary | ICD-10-CM | POA: Insufficient documentation

## 2022-01-07 DIAGNOSIS — S59911A Unspecified injury of right forearm, initial encounter: Secondary | ICD-10-CM | POA: Diagnosis present
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
## 2022-06-29 IMAGING — DX DG CHEST 2V
2 series · 2 of 2 positions shown · non-contrast
Comparison: None.

CLINICAL DATA: 65-year-old female with cough

EXAM:
CHEST - 2 VIEW

[dg chest 2 view (1 of 2)]
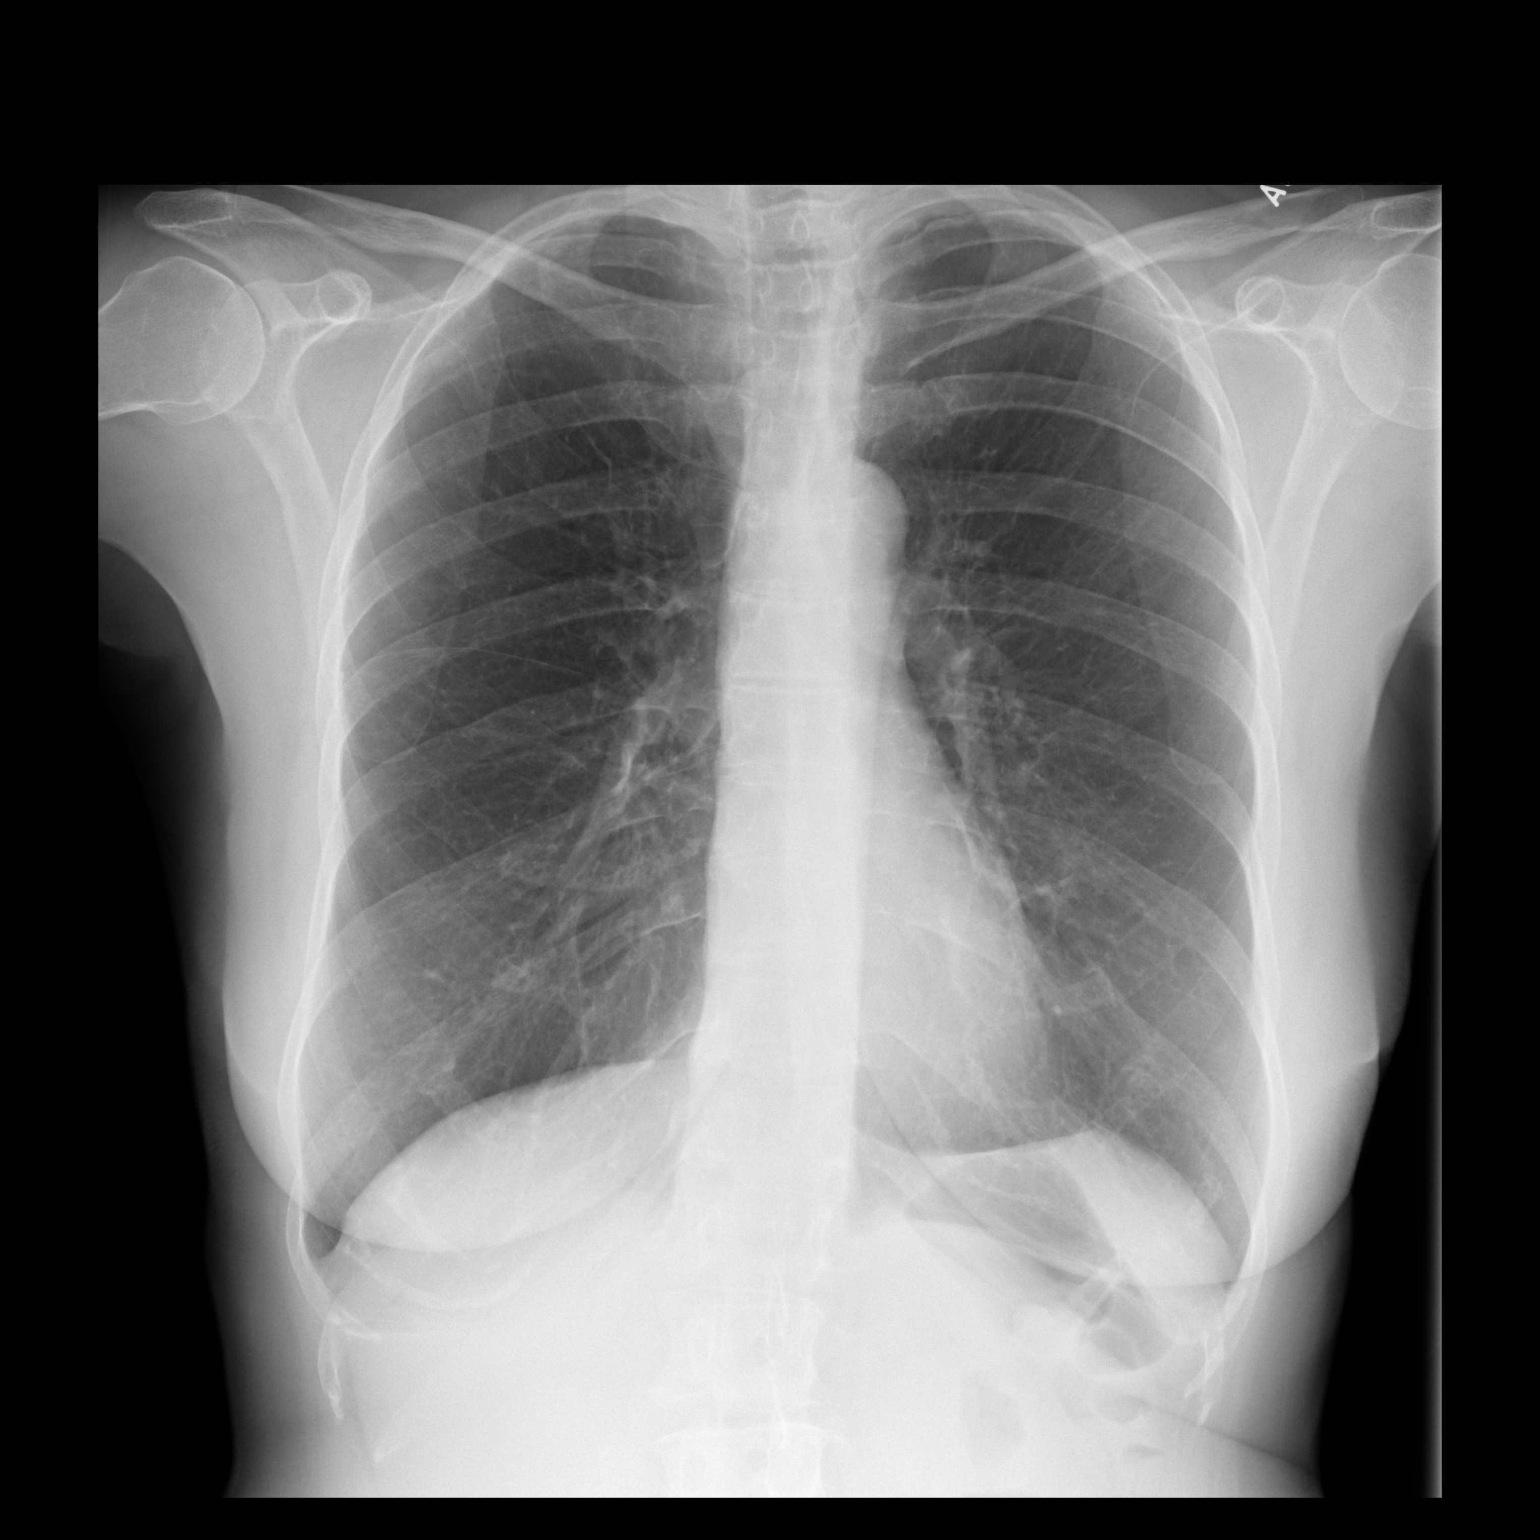

[dg chest 2 view (2 of 2)]
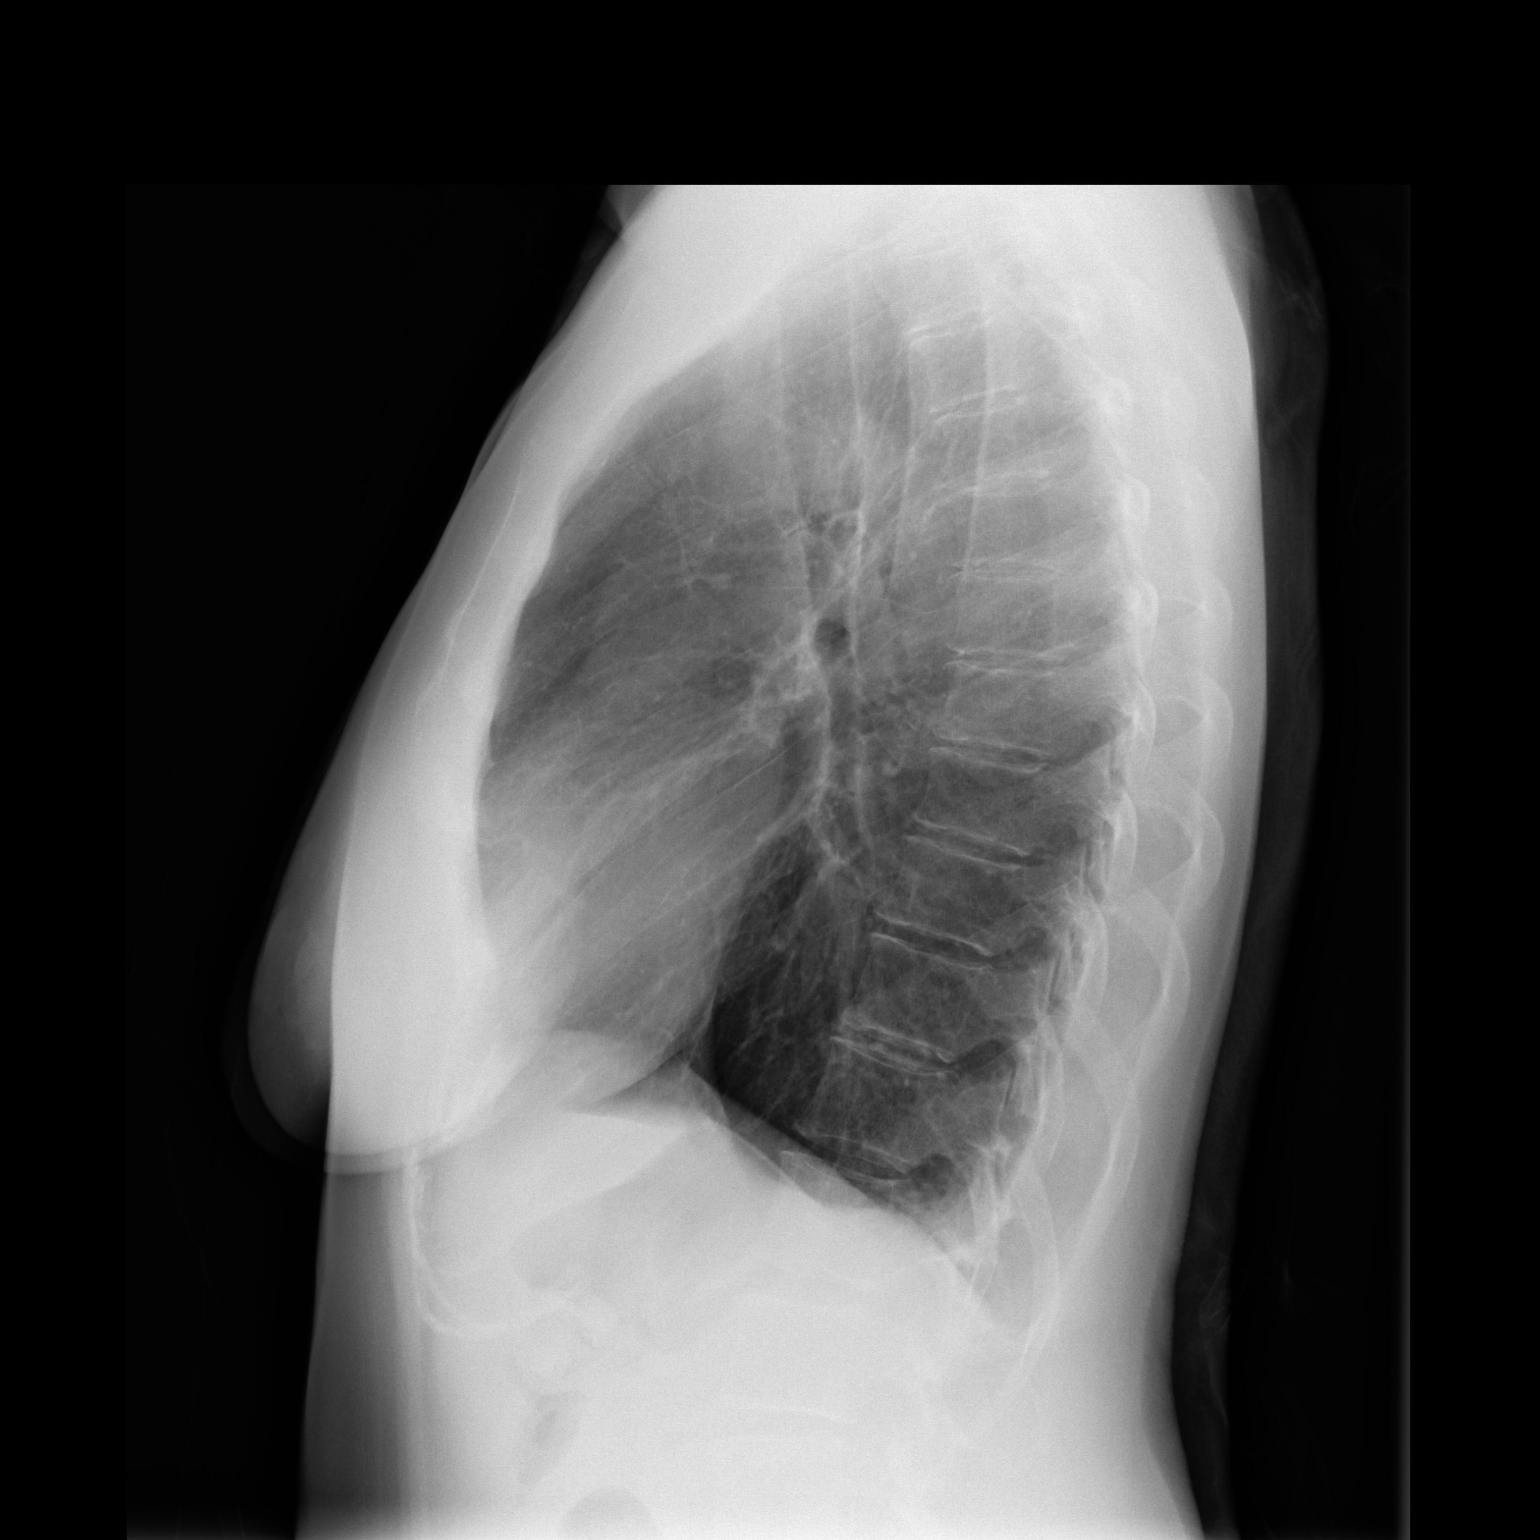

[2 of 2 positions shown; findings below may reference images not displayed]

FINDINGS: Cardiomediastinal silhouette within normal limits in size and
contour. No evidence of central vascular congestion. No interlobular
septal thickening.

No pneumothorax or pleural effusion. Coarsened interstitial
markings, with no confluent airspace disease.

No acute displaced fracture. Degenerative changes of the spine.
IMPRESSION: No active cardiopulmonary disease.

## 2023-07-25 ENCOUNTER — Encounter: Payer: Self-pay | Admitting: Emergency Medicine

## 2023-07-25 ENCOUNTER — Other Ambulatory Visit: Payer: Self-pay

## 2023-07-25 ENCOUNTER — Ambulatory Visit
Admission: EM | Admit: 2023-07-25 | Discharge: 2023-07-25 | Disposition: A | Discharge status: Home / Self Care | Payer: Managed Care, Other (non HMO) | Attending: Emergency Medicine | Admitting: Emergency Medicine

## 2023-07-25 DIAGNOSIS — J0110 Acute frontal sinusitis, unspecified: Secondary | ICD-10-CM | POA: Diagnosis not present

## 2023-07-25 HISTORY — DX: Other cervical disc displacement, unspecified cervical region: M50.20

## 2023-07-25 MED ORDER — FLUCONAZOLE 150 MG PO TABS: 150.0000 mg | ORAL_TABLET | Freq: Every day | ORAL | 0 refills | Status: AC

## 2023-07-25 MED ORDER — AMOXICILLIN-POT CLAVULANATE 875-125 MG PO TABS: 1.0000 | ORAL_TABLET | Freq: Two times a day (BID) | ORAL | 0 refills | Status: AC

## 2023-07-25 NOTE — ED Triage Notes (Signed)
 Patient presents to [facility] for evaluation of 2 weeks of nasal congestion, fever at the beginning, cough.  Patient states she is having yellow mucous coming out of her nasal passages with saline spray, but nothing is coming out now.  Patient c/o dizziness with movement starting last night and nausea.  Patient c/o stuffy bilateral ears and ringing.

## 2023-07-25 NOTE — ED Provider Notes (Signed)
 Renaldo Fiddler    CSN: [id number] Arrival date & time: 07/25/23  2130      History   Chief Complaint Chief Complaint  Patient presents with   URI    HPI NASHALY DORANTES is a 68 y.o. female.   Patient presents for evaluation of nasal congestion, rhinorrhea productive cough, sore throat, intermittent headaches present for 2 weeks.  Begin experiencing bilateral ear fullness, dizziness exacerbated by moving and nausea without vomiting.  Has been using Advil, Sudafed day and night and NyQuil with minimal relief.  Poor appetite but able to tolerate some food and liquids.  Denies fever.  Past Medical History:  Diagnosis Date   Hypothyroidism    Slipped cervical disc    Thyroid disease    Hypothyroid    Patient Active Problem List   Diagnosis Date Noted   Colon cancer screening    Polyp of sigmoid colon    Benign neoplasm of ascending colon    Benign neoplasm of transverse colon    Vaginal atrophy 08/12/2016   Hormone replacement therapy (HRT) 08/12/2016    Past Surgical History:  Procedure Laterality Date   ABDOMINAL HYSTERECTOMY     APPENDECTOMY     BREAST BIOPSY  1990   pt is poor historian, does not remember which breast   BREAST SURGERY     Benign calcifications   COLONOSCOPY WITH PROPOFOL N/A 09/24/2016   Procedure: COLONOSCOPY WITH PROPOFOL;  Surgeon: Midge Minium, MD;  Location: [facility] ENDOSCOPY;  Service: Endoscopy;  Laterality: N/A;    OB History     Gravida  3   Para  2   Term      Preterm      AB  1   Living  2      SAB  1   IAB      Ectopic      Multiple      Live Births               Home Medications    Prior to Admission medications   Medication Sig Start Date End Date Taking? Authorizing Provider  amoxicillin-clavulanate (AUGMENTIN) 875-125 MG tablet Take 1 tablet by mouth every 12 (twelve) hours. 07/25/23  Yes Toya Palacios R, NP  fluconazole (DIFLUCAN) 150 MG tablet Take 1 tablet (150 mg total) by mouth daily for 2  doses. 07/25/23 07/27/23 Yes Valinda Hoar, NP  estradiol (ESTRACE) 0.1 MG/GM vaginal cream Apply 1 gram vaginally twice a week 08/12/16   Ok Edwards, MD  levothyroxine (SYNTHROID, LEVOTHROID) 75 MCG tablet Take 75 mcg by mouth daily before breakfast.    [provider]    Family History Family History  Problem Relation Age of Onset   Cancer Mother        Colon   Stroke Brother     Social History Social History   Tobacco Use   Smoking status: Never   Smokeless tobacco: Never  Substance Use Topics   Alcohol use: Yes    Comment: last week   Drug use: No     Allergies   Gluten meal and Tilactase   Review of Systems Review of Systems   Physical Exam Triage Vital Signs ED Triage Vitals [07/25/23 1016]  Encounter Vitals Group     BP      Systolic BP Percentile      Diastolic BP Percentile      Pulse      Resp      Temp  Temp src      SpO2      Weight      Height      Head Circumference      Peak Flow      Pain Score 0     Pain Loc      Pain Education      Exclude from Growth Chart    No data found.  Updated Vital Signs There were no vitals taken for this visit.  Visual Acuity Right Eye Distance:   Left Eye Distance:   Bilateral Distance:    Right Eye Near:   Left Eye Near:    Bilateral Near:     Physical Exam Constitutional:      Appearance: Normal appearance.  HENT:     Right Ear: A middle ear effusion is present.     Left Ear: A middle ear effusion is present.     Nose: Congestion present.     Right Sinus: Frontal sinus tenderness present.     Left Sinus: Frontal sinus tenderness present.     Mouth/Throat:     Pharynx: No oropharyngeal exudate or posterior oropharyngeal erythema.  Eyes:     Extraocular Movements: Extraocular movements intact.  Cardiovascular:     Rate and Rhythm: Normal rate and regular rhythm.     Pulses: Normal pulses.     Heart sounds: Normal heart sounds.  Pulmonary:     Effort: Pulmonary  effort is normal.     Breath sounds: Normal breath sounds.  Musculoskeletal:     Cervical back: Normal range of motion and neck supple.  Neurological:     Mental Status: She is alert and oriented to person, place, and time. Mental status is at baseline.      UC Treatments / Results  Labs (all labs ordered are listed, but only abnormal results are displayed) Labs Reviewed - No data to display  EKG   Radiology No results found.  Procedures Procedures (including critical care time)  Medications Ordered in UC Medications - No data to display  Initial Impression / Assessment and Plan / UC Course  I have reviewed the triage vital signs and the nursing notes.  Pertinent labs & imaging results that were available during my care of the patient were reviewed by me and considered in my medical decision making (see chart for details).  Acute nonrecurrent frontal sinusitis  Patient is in no signs of distress nor toxic appearing.  Vital signs are stable.  Low suspicion for pneumonia, pneumothorax or bronchitis and therefore will defer imaging.  Presentation consistent with a sinusitis, discussed this with patient, prescribed Augmentin, Diflucan prescribed prophylactically. May use additional over-the-counter medications as needed for supportive care.  May follow-up with urgent care as needed if symptoms persist or worsen.   Final Clinical Impressions(s) / UC Diagnoses   Final diagnoses:  Acute non-recurrent frontal sinusitis     Discharge Instructions      Today you are being treated for sinus infection  Begin Augmentin every morning and every evening for 7 days, if you begin to have vaginal irritation or itching take 1 Diflucan tablet and then take second dose after completion of all medicine    You can take Tylenol and/or Ibuprofen as needed for fever reduction and pain relief.   For cough: honey 1/2 to 1 teaspoon (you can dilute the honey in water or another fluid).  You can  also use guaifenesin and dextromethorphan for cough. You can use a humidifier for  chest congestion and cough.  If you don't have a humidifier, you can sit in the bathroom with the hot shower running.      For sore throat: try warm salt water gargles, cepacol lozenges, throat spray, warm tea or water with lemon/honey, popsicles or ice, or OTC cold relief medicine for throat discomfort.   For congestion: take a daily anti-histamine like Zyrtec, Claritin, and a oral decongestant, such as pseudoephedrine.  You can also use Flonase 1-2 sprays in each nostril daily.   It is important to stay hydrated: drink plenty of fluids (water, gatorade/powerade/pedialyte, juices, or teas) to keep your throat moisturized and help further relieve irritation/discomfort.    ED Prescriptions     Medication Sig Dispense Auth. Provider   amoxicillin-clavulanate (AUGMENTIN) 875-125 MG tablet Take 1 tablet by mouth every 12 (twelve) hours. 14 tablet Nyzaiah Kai R, NP   fluconazole (DIFLUCAN) 150 MG tablet Take 1 tablet (150 mg total) by mouth daily for 2 doses. 2 tablet Valinda Hoar, NP      PDMP not reviewed this encounter.   Valinda Hoar, NP 07/25/23 1120

## 2023-07-25 NOTE — Discharge Instructions (Signed)
 Today you are being treated for sinus infection  Begin Augmentin every morning and every evening for 7 days, if you begin to have vaginal irritation or itching take 1 Diflucan tablet and then take second dose after completion of all medicine    You can take Tylenol and/or Ibuprofen as needed for fever reduction and pain relief.   For cough: honey 1/2 to 1 teaspoon (you can dilute the honey in water or another fluid).  You can also use guaifenesin and dextromethorphan for cough. You can use a humidifier for chest congestion and cough.  If you don't have a humidifier, you can sit in the bathroom with the hot shower running.      For sore throat: try warm salt water gargles, cepacol lozenges, throat spray, warm tea or water with lemon/honey, popsicles or ice, or OTC cold relief medicine for throat discomfort.   For congestion: take a daily anti-histamine like Zyrtec, Claritin, and a oral decongestant, such as pseudoephedrine.  You can also use Flonase 1-2 sprays in each nostril daily.   It is important to stay hydrated: drink plenty of fluids (water, gatorade/powerade/pedialyte, juices, or teas) to keep your throat moisturized and help further relieve irritation/discomfort.

## 2023-09-04 ENCOUNTER — Telehealth: Payer: Self-pay

## 2023-09-04 NOTE — Telephone Encounter (Signed)
 PT requesting call back to schedule to schedule colon was told to sched after 5 years

## 2023-09-05 ENCOUNTER — Other Ambulatory Visit: Payer: Self-pay

## 2023-09-05 ENCOUNTER — Telehealth: Payer: Self-pay

## 2023-09-05 DIAGNOSIS — Z860100 Personal history of colon polyps, unspecified: Secondary | ICD-10-CM

## 2023-09-05 DIAGNOSIS — Z800 Family history of malignant neoplasm of digestive organs: Secondary | ICD-10-CM

## 2023-09-05 MED ORDER — NA SULFATE-K SULFATE-MG SULF 17.5-3.13-1.6 GM/177ML PO SOLN: 1.0000 | Freq: Once | ORAL | 0 refills | Status: AC

## 2023-09-05 NOTE — Telephone Encounter (Signed)
 Gastroenterology Pre-Procedure Review  Request Date: 10/02/23 Requesting Physician: Dr. Servando Snare  PATIENT REVIEW QUESTIONS: The patient responded to the following health history questions as indicated:    1. Are you having any GI issues? no 2. Do you have a personal history of Polyps? yes (last colonoscopy performed by Dr. Servando Snare 09/24/2016 recommended repeat in 5 years) 3. Do you have a family history of Colon Cancer or Polyps? yes (mother colon cancer) 4. Diabetes Mellitus? no 5. Joint replacements in the past 12 months?no 6. Major health problems in the past 3 months?no 7. Any artificial heart valves, MVP, or defibrillator?no    MEDICATIONS & ALLERGIES:    Patient reports the following regarding taking any anticoagulation/antiplatelet therapy:   Plavix, Coumadin, Eliquis, Xarelto, Lovenox, Pradaxa, Brilinta, or Effient? no Aspirin? no  Patient confirms/reports the following medications:  Current Outpatient Medications  Medication Sig Dispense Refill   diazepam (VALIUM) 2 MG tablet Take by mouth.     Na Sulfate-K Sulfate-Mg Sulfate concentrate (SUPREP) 17.5-3.13-1.6 GM/177ML SOLN Take 1 kit (354 mLs total) by mouth once for 1 dose. 354 mL 0   oxyCODONE-acetaminophen (PERCOCET/ROXICET) 5-325 MG tablet 1 tablet as needed Orally every 6 hrs     amoxicillin-clavulanate (AUGMENTIN) 875-125 MG tablet Take 1 tablet by mouth every 12 (twelve) hours. 14 tablet 0   celecoxib (CELEBREX) 200 MG capsule 1 capsule with food Oral Once a day as needed     Cholecalciferol 50 MCG (2000 UT) CAPS 1 capsule Orally Once a day     Cholecalciferol 50 MCG (2000 UT) TABS Take by mouth.     cyanocobalamin (VITAMIN B12) 1000 MCG tablet Take by mouth.     levothyroxine (SYNTHROID, LEVOTHROID) 75 MCG tablet Take 75 mcg by mouth daily before breakfast.     Oyster Shell Calcium 500 MG TABS 1 tablet with meals Orally Twice a day     No current facility-administered medications for this visit.    Patient  confirms/reports the following allergies:  Allergies  Allergen Reactions   Gluten Meal Nausea And Vomiting    intolerance   Tilactase Nausea And Vomiting    intolerance    Orders Placed This Encounter  Procedures   Ambulatory referral to Gastroenterology    Referral Priority:   Routine    Referral Type:   Consultation    Referral Reason:   Specialty Services Required    Referred to Provider:   Midge Minium, MD    Number of Visits Requested:   1    AUTHORIZATION INFORMATION Primary Insurance: 1D#: Group #:  Secondary Insurance: 1D#: Group #:  SCHEDULE INFORMATION: Date: 10/02/23 Time: Location: [facility]

## 2023-09-11 ENCOUNTER — Telehealth: Payer: Self-pay

## 2023-09-11 NOTE — Telephone Encounter (Signed)
 Pt requesting call back to get pre info resent for colonoscopy

## 2023-09-15 NOTE — Telephone Encounter (Signed)
 Left voice message asking if pt needed to get prep or if it was instructions that she was needing.  Asked her to call me back to clarify what she is needing.  Thanks, [place], [place]

## 2023-09-15 NOTE — Telephone Encounter (Signed)
 Pt returned phone call. Stated that she did not receive instructions for her procedure.  I informed her that I can show her how to get to her instructions in mychart.  She said she is not very computer savy.  I also informed her that I can email them but she didn't want me to do that.  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  She said when I get off it would be too late.  And asked me to place it in the mail again.  Informed her that I would print and mail.  Thanks, [place], [place]

## 2023-09-25 ENCOUNTER — Encounter: Payer: Self-pay | Admitting: Gastroenterology

## 2023-10-02 ENCOUNTER — Ambulatory Visit
Admission: RE | Admit: 2023-10-02 | Discharge: 2023-10-02 | Disposition: A | Discharge status: Home / Self Care | Source: Ambulatory Visit | Attending: Gastroenterology | Admitting: Gastroenterology

## 2023-10-02 SURGERY — COLONOSCOPY
Anesthesia: General

## 2023-10-02 NOTE — OR Nursing (Addendum)
 MD delayed out of town, Patient refused to have different doctor doing her procedure. Questionned if she is gonna be charged for this as it is not her fault.spoke with nurse manager and will be no charge.

## 2023-10-03 ENCOUNTER — Telehealth: Payer: Self-pay

## 2023-10-03 NOTE — Telephone Encounter (Signed)
 Returned phone call to patient in regard to her colonoscopy having to be performed by Dr. Baldomero Bone yesterday instead of Dr. Ole Berkeley due to scheduling error.  She was quite upset about this and did not want anyone other than Dr. Ole Berkeley to perform her colonoscopy.  I apologized for the inconvenience she experienced yesterday and asked her if she would like for me to reschedule her with Dr. Ole Berkeley.  She said she feels this incident yesterday was divine intervention and thinks worked out for the best.  She was not really wanting to have her colonoscopy at [facility] but because she knows that Dr. Ole Berkeley is very thorough with his work, and has had him in the past-she preferred him.  She thinks he is the best GI in [place] she wanted him to perform her colonoscopy again.  She declined to reschedule with Dr. Ole Berkeley at [facility], however stated that if he should ever scope in the [facility] area she would definitely go back to him.    She wanted me to let Dr. Ole Berkeley know that she still thinks he is the BEST GI Physician in [place], and to let him know that it is not anything personally about him because he is the BEST and everyone in the office has been really kind to her.  Thanks, [place], [place]

## 2023-10-09 ENCOUNTER — Telehealth: Payer: Self-pay

## 2023-10-09 NOTE — Telephone Encounter (Addendum)
 The patient called and left a voicemail regarding her medical records request for her colonoscopy. She mentioned that she has been trying for a week to obtain her records, which were supposed to be emailed to her. She explained that she had requested her procedure records when she was informed at the last minute that Dr. Ole Berkeley was on vacation and subsequently reached out to the hospital about her records over a week ago.  I called the patient back to inform her that we received her voicemail, but she did not answer. I left her a voicemail with the information and provided our contact number: [phone].

## 2023-10-09 NOTE — Telephone Encounter (Signed)
 The patient called back and left a voicemail stating that she was returning my call. I called her back to inform her that we received her voicemail and confirmed that she was inquiring about her medical records request.  I asked my coworker if a medical record request had been received for [place]. My coworker advised that the request was sent to [facility] and the fax was received. The patient mentioned that when she spoke to her provider yesterday, they had not received the records.  I informed the patient that my coworker confirmed the records were faxed out. I advised the patient to contact her provider to confirm whether the fax was received. If not, I asked her to call us  back tomorrow, and we would be happy to assist further. The patient understood and said she would contact her provider tomorrow. She expressed her hope not to have to call us  back and thanked me for my help.

## 2024-06-28 ENCOUNTER — Encounter: Payer: Self-pay | Admitting: Gastroenterology
